# Patient Record
Sex: Female | Born: 2000 | Hispanic: Yes | Marital: Single | Smoking: Never smoker
Health system: Southern US, Community
[De-identification: ages and names within clinical notes are randomized; demographics above are authoritative.]

## PROBLEM LIST (undated history)

## (undated) DIAGNOSIS — J45909 Unspecified asthma, uncomplicated: Secondary | ICD-10-CM

## (undated) DIAGNOSIS — D649 Anemia, unspecified: Secondary | ICD-10-CM

---
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
## 2019-09-13 ENCOUNTER — Other Ambulatory Visit: Payer: Self-pay

## 2019-09-13 ENCOUNTER — Emergency Department (HOSPITAL_BASED_OUTPATIENT_CLINIC_OR_DEPARTMENT_OTHER)
Admission: EM | Admit: 2019-09-13 | Discharge: 2019-09-14 | Disposition: A | Discharge status: Home / Self Care | Payer: Medicaid Other | Attending: Emergency Medicine | Admitting: Emergency Medicine

## 2019-09-13 ENCOUNTER — Encounter (HOSPITAL_BASED_OUTPATIENT_CLINIC_OR_DEPARTMENT_OTHER): Payer: Self-pay | Admitting: Emergency Medicine

## 2019-09-13 DIAGNOSIS — Z5321 Procedure and treatment not carried out due to patient leaving prior to being seen by health care provider: Secondary | ICD-10-CM | POA: Insufficient documentation

## 2019-09-13 DIAGNOSIS — R079 Chest pain, unspecified: Secondary | ICD-10-CM | POA: Insufficient documentation

## 2019-09-13 HISTORY — DX: Unspecified asthma, uncomplicated: J45.909

## 2019-09-13 NOTE — ED Triage Notes (Signed)
Pt states she tried an edible for the first time on Saturday and had a panic attack that lasted about 4 hrs  Pt states she has been having chest pain since

## 2019-09-14 MED ORDER — IBUPROFEN 200 MG PO TABS
200.0000 mg | ORAL_TABLET | Freq: Once | ORAL | Status: AC
  Administered 2019-09-14: 200 mg via ORAL
  Filled 2019-09-14: qty 1

## 2020-07-26 ENCOUNTER — Other Ambulatory Visit: Payer: Self-pay

## 2020-07-26 ENCOUNTER — Emergency Department (HOSPITAL_BASED_OUTPATIENT_CLINIC_OR_DEPARTMENT_OTHER): Payer: Medicaid Other

## 2020-07-26 ENCOUNTER — Encounter (HOSPITAL_BASED_OUTPATIENT_CLINIC_OR_DEPARTMENT_OTHER): Payer: Self-pay | Admitting: Emergency Medicine

## 2020-07-26 ENCOUNTER — Emergency Department (HOSPITAL_BASED_OUTPATIENT_CLINIC_OR_DEPARTMENT_OTHER)
Admission: EM | Admit: 2020-07-26 | Discharge: 2020-07-26 | Disposition: A | Discharge status: Home / Self Care | Payer: Medicaid Other | Attending: Emergency Medicine | Admitting: Emergency Medicine

## 2020-07-26 DIAGNOSIS — J45909 Unspecified asthma, uncomplicated: Secondary | ICD-10-CM | POA: Insufficient documentation

## 2020-07-26 DIAGNOSIS — F419 Anxiety disorder, unspecified: Secondary | ICD-10-CM | POA: Diagnosis not present

## 2020-07-26 DIAGNOSIS — R0789 Other chest pain: Secondary | ICD-10-CM | POA: Insufficient documentation

## 2020-07-26 DIAGNOSIS — R000 Tachycardia, unspecified: Secondary | ICD-10-CM | POA: Diagnosis not present

## 2020-07-26 MED ORDER — HYDROXYZINE HCL 25 MG PO TABS: 25.0000 mg | ORAL_TABLET | Freq: Four times a day (QID) | ORAL | 0 refills | Status: AC

## 2020-07-26 MED ORDER — HYDROXYZINE HCL 25 MG PO TABS
25.0000 mg | ORAL_TABLET | Freq: Once | ORAL | Status: AC
  Administered 2020-07-26: 25 mg via ORAL
  Filled 2020-07-26: qty 1

## 2020-07-26 NOTE — ED Triage Notes (Signed)
Pt arrives pov, ambulatory to triage with c/o mid sternal and left side CP "for several months". Pt was seen at [facility] Friday, and was concerned that EKG not performed. Pt endorses anxiety, reports could be exacerbation of anxiety. Pt denies shob. Pt reports trying edibles in July last year and feels that her anxiety started then

## 2020-07-26 NOTE — Discharge Instructions (Addendum)
Please read the attached information about outpatient counseling.  Please note the substance abuse portion.  Please drink plenty of water please use Atarax which is also called hydroxyzine or Vistaril.  You may take this medication 3 times daily as needed for anxiety.

## 2020-07-26 NOTE — ED Notes (Signed)
Portable Xray at bedside.

## 2020-07-26 NOTE — ED Provider Notes (Signed)
[facility] EMERGENCY DEPARTMENT Provider Note   CSN: [id number] Arrival date & time: 07/26/20  1335     History Chief Complaint  Patient presents with   Chest Pain    Alicia Holloway is a 20 y.o. female.  HPI Patient is a 20 year old female with a past medical history significant for asthma and anemia she is presented today for intermittent chest pains that been ongoing for approximately 1 year.  She states that she was seen by her primary care doctor in the past and had a normal EKG and was given reassurance however she states that her symptoms continue intermittently.  She states that the pain is more of a pressure and tightness sensation she complains of this affecting her mid sternum as well as the entire left side of her chest.  She states that it seems to come on intermittently and without any significant provoking factors.  She states that when she feels the pain or any pressure or tightness she begins worrying about having a heart attack.  She states that she was worried she was having a heart attack today which prompted her to come to the ER.  She has no history of smoking she states that she has no history of recreational drug use apart from 1 year ago when she did a edible which seem to cause some anxiety and seem to precipitate these episodes.  She has not done any recreational drugs since.  Notably she was in [place] last night and did drink some alcoholic beverages she is not certain how many she had but she said less than 4.  States that she felt somewhat lightheaded yesterday and became very anxious about this as well.  She do not have any chest pain yesterday but states that she has had chest tightness all day today.  Denies any shortness of breath.  States that she has no nausea or vomiting no diarrhea.  No fevers or chills.  No chest trauma.  No history of pneumothorax  No recent surgeries, hospitalization, long travel, hemoptysis, estrogen containing OCP, cancer  history.  No unilateral leg swelling.  No history of PE or VTE.     Past Medical History:  Diagnosis Date   Anemia    Asthma     There are no problems to display for this patient.   History reviewed. No pertinent surgical history.   OB History   No obstetric history on file.     Family History  Problem Relation Age of Onset   Hypertension Mother    Hypertension Father    Cancer Other     Social History   Tobacco Use   Smoking status: Never   Smokeless tobacco: Never  Vaping Use   Vaping Use: Never used  Substance Use Topics   Alcohol use: Not Currently   Drug use: No    Home Medications Prior to Admission medications   Medication Sig Start Date End Date Taking? Authorizing Provider  hydrOXYzine (ATARAX/VISTARIL) 25 MG tablet Take 1 tablet (25 mg total) by mouth every 6 (six) hours for 14 days. 07/26/20 08/09/20 Yes Loriel Diehl, Stevphen Meuse S, PA  ferrous sulfate 325 (65 FE) MG tablet Take 1 tablet (325 mg total) by mouth daily. 05/09/11 05/08/12  Elson Areas, PA-C  ibuprofen (ADVIL,MOTRIN) 200 MG tablet Take 200 mg by mouth every 6 (six) hours as needed. For pain    [provider]    Allergies    Patient has no known allergies.  Review of  Systems   Review of Systems  Constitutional:  Negative for chills and fever.  HENT:  Negative for congestion.   Eyes:  Negative for pain.  Respiratory:  Negative for cough and shortness of breath.   Cardiovascular:  Positive for chest pain. Negative for leg swelling.  Gastrointestinal:  Negative for abdominal pain and vomiting.  Genitourinary:  Negative for dysuria.  Musculoskeletal:  Negative for myalgias.  Skin:  Negative for rash.  Neurological:  Negative for dizziness and headaches.   Physical Exam Updated Vital Signs BP 128/84   Pulse 84   Temp 98.5 F (36.9 C) (Oral)   Resp 16   Ht 5\' 6"  (1.676 m)   Wt 68 kg   LMP 07/05/2020   SpO2 99%   BMI 24.21 kg/m   Physical Exam Vitals and nursing note reviewed.   Constitutional:      General: She is not in acute distress.    Comments: Tearful 20 year old female appears anxious but in no acute distress.  HENT:     Head: Normocephalic and atraumatic.     Nose: Nose normal.  Eyes:     General: No scleral icterus. Cardiovascular:     Rate and Rhythm: Regular rhythm. Tachycardia present.     Pulses: Normal pulses.     Heart sounds: Normal heart sounds.  Pulmonary:     Effort: Pulmonary effort is normal. No respiratory distress.     Breath sounds: No wheezing.  Abdominal:     Palpations: Abdomen is soft.     Tenderness: There is no abdominal tenderness.  Musculoskeletal:     Cervical back: Normal range of motion.     Right lower leg: No edema.     Left lower leg: No edema.     Comments: No lower extremity edema or calf tenderness  Skin:    General: Skin is warm and dry.     Capillary Refill: Capillary refill takes less than 2 seconds.  Neurological:     Mental Status: She is alert. Mental status is at baseline.  Psychiatric:        Mood and Affect: Mood normal.        Behavior: Behavior normal.    ED Results / Procedures / Treatments   Labs (all labs ordered are listed, but only abnormal results are displayed) Labs Reviewed - No data to display  EKG EKG Interpretation  Date/Time:  Saturday July 26 2020 13:48:31 EDT Ventricular Rate:  100 PR Interval:  147 QRS Duration: 91 QT Interval:  349 QTC Calculation: 451 R Axis:   -10 Text Interpretation: Sinus tachycardia Confirmed by [email or web address] ([id number]) on 07/26/2020 1:59:28 PM  Radiology DG Chest Portable 1 View  Result Date: 07/26/2020 CLINICAL DATA:  C/o sporadic Lt sided chest pain since July 2021. Hx asthma (exercise-induced). Patient has nipple jewelry that she cannot remove EXAM: PORTABLE CHEST - 1 VIEW COMPARISON:  none FINDINGS: Lungs are clear. Heart size and mediastinal contours are within normal limits. No effusion.  No pneumothorax. Visualized bones unremarkable.  IMPRESSION: No acute cardiopulmonary disease. Electronically Signed   By: August 2021 M.D.   On: 07/26/2020 14:11    Procedures Procedures   Medications Ordered in ED Medications  hydrOXYzine (ATARAX/VISTARIL) tablet 25 mg (has no administration in time range)    ED Course  I have reviewed the triage vital signs and the nursing notes.  Pertinent labs & imaging results that were available during my care of the patient were reviewed by me and considered  in my medical decision making (see chart for details).    MDM Rules/Calculators/A&P                          Patient is 20 year old female healthy no history of ACS.  No history of smoking, diabetes, HTN, HLD.  She is not on any oral contraceptive medication she is PERC negative and has very atypical sounding chest pain seems to have been ongoing intermittently for 1 year.  States that she is very concerned about heart attack.  EKG is normal sinus rhythm no axis deviation heart rate is 100.  On my initial evaluation patient's heart rate is 100 on the monitor after talking slowly and encouraging some deep breathing patient's heart rate dropped to 84 she satting 100% on room air the entire time I was in the room with her.  Apart from being tearful she is very well-appearing.  She states that her pain is improved some just during our discussion.   I specifically doubt PE given that she is nontachycardic, hypoxic, and has been having symptoms for over a year.  Also doubt ACS, pericarditis, myocarditis, pneumothorax was ruled out with chest x-ray which shows no pneumothorax.  She also has good lung sounds in all fields.  She is also not short of breath.  Also doubt thoracic artery dissection or other acute emergent condition.  Patient discharged with Vistaril, resources for therapy in the area and recommendations for PCP follow-up.  Return precautions given.  She is understanding of plan.  Final Clinical Impression(s) / ED Diagnoses Final  diagnoses:  Atypical chest pain  Anxiety    Rx / DC Orders ED Discharge Orders          Ordered    hydrOXYzine (ATARAX/VISTARIL) 25 MG tablet  Every 6 hours        07/26/20 1425             Solon Augusta [place], [place] 07/26/20 1453    Virgina Norfolk, DO 07/27/20 0801

## 2020-08-13 ENCOUNTER — Encounter (HOSPITAL_BASED_OUTPATIENT_CLINIC_OR_DEPARTMENT_OTHER): Payer: Self-pay | Admitting: Emergency Medicine

## 2020-08-13 ENCOUNTER — Other Ambulatory Visit: Payer: Self-pay

## 2020-08-13 ENCOUNTER — Emergency Department (HOSPITAL_BASED_OUTPATIENT_CLINIC_OR_DEPARTMENT_OTHER)
Admission: EM | Admit: 2020-08-13 | Discharge: 2020-08-13 | Disposition: A | Discharge status: Home / Self Care | Payer: Medicaid Other | Attending: Emergency Medicine | Admitting: Emergency Medicine

## 2020-08-13 ENCOUNTER — Emergency Department (HOSPITAL_BASED_OUTPATIENT_CLINIC_OR_DEPARTMENT_OTHER): Payer: Medicaid Other

## 2020-08-13 DIAGNOSIS — R079 Chest pain, unspecified: Secondary | ICD-10-CM

## 2020-08-13 DIAGNOSIS — R072 Precordial pain: Secondary | ICD-10-CM | POA: Insufficient documentation

## 2020-08-13 DIAGNOSIS — J45909 Unspecified asthma, uncomplicated: Secondary | ICD-10-CM | POA: Diagnosis not present

## 2020-08-13 LAB — BASIC METABOLIC PANEL
Anion gap: 7 (ref 5–15)
BUN: 10 mg/dL (ref 6–20)
CO2: 26 mmol/L (ref 22–32)
Calcium: 9.1 mg/dL (ref 8.9–10.3)
Chloride: 103 mmol/L (ref 98–111)
Creatinine, Ser: 0.81 mg/dL (ref 0.44–1.00)
GFR, Estimated: >60 mL/min (ref >60)
Glucose, Bld: 98 mg/dL (ref 70–99)
Potassium: 4.2 mmol/L (ref 3.5–5.1)
Sodium: 136 mmol/L (ref 135–145)

## 2020-08-13 LAB — CBC
HCT: 37.4 % (ref 36.0–46.0)
Hemoglobin: 11.5 g/dL — ABNORMAL LOW (ref 12.0–15.0)
MCH: 22.7 pg — ABNORMAL LOW (ref 26.0–34.0)
MCHC: 30.7 g/dL (ref 30.0–36.0)
MCV: 73.9 fL — ABNORMAL LOW (ref 80.0–100.0)
Platelets: 251 10*3/uL (ref 150–400)
RBC: 5.06 MIL/uL (ref 3.87–5.11)
RDW: 16.4 % — ABNORMAL HIGH (ref 11.5–15.5)
WBC: 7.1 10*3/uL (ref 4.0–10.5)
nRBC: 0 % (ref 0.0–0.2)

## 2020-08-13 LAB — TROPONIN I (HIGH SENSITIVITY)
Troponin I (High Sensitivity): <2 ng/L (ref <18)
Troponin I (High Sensitivity): <2 ng/L (ref <18)

## 2020-08-13 LAB — PREGNANCY, URINE: Preg Test, Ur: NEGATIVE

## 2020-08-13 NOTE — ED Triage Notes (Signed)
Pt reports having chest pain while working today. Pt reports the pain is in the middle of her chest. Pt states this has been intermittent since July 2021.

## 2020-08-13 NOTE — Discharge Instructions (Addendum)
You were seen in the emergency department for chest pain.  You had blood work EKG and a chest x-ray that did not show an obvious explanation for your symptoms.  Please follow-up with your primary care doctor.  Return to the emergency department if any worsening or concerning symptoms.

## 2020-08-13 NOTE — ED Provider Notes (Signed)
[facility] EMERGENCY DEPARTMENT Provider Note   CSN: [id number] Arrival date & time: 08/13/20  1705     History Chief Complaint  Patient presents with   Chest Pain    Alicia Holloway is a 20 y.o. female.  She has a history of anemia.  She was at work today when she experienced substernal chest pain.  They checked her blood pressure there and found it to be elevated.  She said currently the pain is a 0.  She is had this pain before on and off for over a year.  She sometimes can make the pain happen with the way she moves her arms.  No reflux symptoms.  No radiation of the pain.  Not associated with nausea vomiting dizziness diaphoresis.  She is a non-smoker and denies any cocaine.  No history of young heart disease in her family.  The history is provided by the patient.  Chest Pain Pain location:  Substernal area Pain quality: stabbing   Pain radiates to:  Does not radiate Pain severity:  Moderate Onset quality:  Sudden Duration:  20 minutes Timing:  Sporadic Progression:  Resolved Chronicity:  Recurrent Context: movement and raising an arm   Relieved by:  None tried Worsened by:  Certain positions Ineffective treatments:  None tried Associated symptoms: no abdominal pain, no back pain, no cough, no diaphoresis, no fever, no headache, no heartburn, no nausea, no shortness of breath and no vomiting   Risk factors: no smoking       Past Medical History:  Diagnosis Date   Anemia    Asthma     There are no problems to display for this patient.   History reviewed. No pertinent surgical history.   OB History   No obstetric history on file.     Family History  Problem Relation Age of Onset   Hypertension Mother    Hypertension Father    Cancer Other     Social History   Tobacco Use   Smoking status: Never   Smokeless tobacco: Never  Vaping Use   Vaping Use: Never used  Substance Use Topics   Alcohol use: Not Currently   Drug use: No    Home  Medications Prior to Admission medications   Medication Sig Start Date End Date Taking? Authorizing Provider  ferrous sulfate 325 (65 FE) MG tablet Take 1 tablet (325 mg total) by mouth daily. 05/09/11 05/08/12  Elson Areas, PA-C  ibuprofen (ADVIL,MOTRIN) 200 MG tablet Take 200 mg by mouth every 6 (six) hours as needed. For pain    [provider]    Allergies    Patient has no known allergies.  Review of Systems   Review of Systems  Constitutional:  Negative for diaphoresis and fever.  HENT:  Negative for sore throat.   Eyes:  Negative for visual disturbance.  Respiratory:  Negative for cough and shortness of breath.   Cardiovascular:  Positive for chest pain.  Gastrointestinal:  Negative for abdominal pain, heartburn, nausea and vomiting.  Genitourinary:  Negative for dysuria.  Musculoskeletal:  Negative for back pain.  Skin:  Negative for rash.  Neurological:  Negative for headaches.   Physical Exam Updated Vital Signs BP 136/72 (BP Location: Left Arm)   Pulse 87   Temp 98.2 F (36.8 C) (Oral)   Resp 16   Ht 5' 6.5" (1.689 m)   Wt 68 kg   SpO2 100%   BMI 23.85 kg/m   Physical Exam Vitals and  nursing note reviewed.  Constitutional:      General: She is not in acute distress.    Appearance: Normal appearance. She is well-developed.  HENT:     Head: Normocephalic and atraumatic.  Eyes:     Conjunctiva/sclera: Conjunctivae normal.  Cardiovascular:     Rate and Rhythm: Normal rate and regular rhythm.     Heart sounds: No murmur heard. Pulmonary:     Effort: Pulmonary effort is normal. No respiratory distress.     Breath sounds: Normal breath sounds.  Abdominal:     Palpations: Abdomen is soft.     Tenderness: There is no abdominal tenderness. There is no guarding or rebound.  Musculoskeletal:        General: Normal range of motion.     Cervical back: Neck supple.     Right lower leg: No edema.     Left lower leg: No edema.  Skin:    General: Skin  is warm and dry.  Neurological:     General: No focal deficit present.     Mental Status: She is alert.    ED Results / Procedures / Treatments   Labs (all labs ordered are listed, but only abnormal results are displayed) Labs Reviewed  CBC - Abnormal; Notable for the following components:      Result Value   Hemoglobin 11.5 (*)    MCV 73.9 (*)    MCH 22.7 (*)    RDW 16.4 (*)    All other components within normal limits  BASIC METABOLIC PANEL  PREGNANCY, URINE  TROPONIN I (HIGH SENSITIVITY)  TROPONIN I (HIGH SENSITIVITY)    EKG EKG Interpretation  Date/Time:  Wednesday August 13 2020 17:21:22 EDT Ventricular Rate:  89 PR Interval:  134 QRS Duration: 72 QT Interval:  346 QTC Calculation: 420 R Axis:   31 Text Interpretation: Normal sinus rhythm Normal ECG No significant change since prior 6/21 Confirmed by Meridee Score [phone]) on 08/13/2020 5:24:09 PM  Radiology DG Chest 2 View  Result Date: 08/13/2020 CLINICAL DATA:  Chest pain EXAM: CHEST - 2 VIEW COMPARISON:  None. FINDINGS: The heart size and mediastinal contours are within normal limits. Both lungs are clear. The visualized skeletal structures are unremarkable. IMPRESSION: No active cardiopulmonary disease. Electronically Signed   By: Jasmine Pang M.D.   On: 08/13/2020 18:11    Procedures Procedures   Medications Ordered in ED Medications - No data to display  ED Course  I have reviewed the triage vital signs and the nursing notes.  Pertinent labs & imaging results that were available during my care of the patient were reviewed by me and considered in my medical decision making (see chart for details).    MDM Rules/Calculators/A&P                         This patient complains of substernal chest pain; this involves an extensive number of treatment Options and is a complaint that carries with it a high risk of complications and Morbidity. The differential includes ACS, PE, pneumothorax, musculoskeletal,  reflux, vascular  I ordered, reviewed and interpreted labs, which included CBC with normal white count, hemoglobin low similar to priors, chemistries normal, pregnancy test negative, troponin unremarkable  I ordered imaging studies which included chest x-ray and I independently    visualized and interpreted imaging which showed no acute findings  Previous records obtained and reviewed in epic, no recent admissions  After the interventions stated above, I  reevaluated the patient and found patient to be hemodynamically stable.  Satting 100% on room air.  No indications for further work-up at this time. recommended PCP follow-up.  Return instructions discussed   Final Clinical Impression(s) / ED Diagnoses Final diagnoses:  Nonspecific chest pain    Rx / DC Orders ED Discharge Orders     None        Terrilee Files, MD 08/14/20 1023

## 2021-03-23 ENCOUNTER — Emergency Department (HOSPITAL_COMMUNITY)
Admission: EM | Admit: 2021-03-23 | Discharge: 2021-03-23 | Disposition: A | Discharge status: Home / Self Care | Payer: Medicaid Other | Attending: Emergency Medicine | Admitting: Emergency Medicine

## 2021-03-23 ENCOUNTER — Other Ambulatory Visit: Payer: Self-pay

## 2021-03-23 ENCOUNTER — Encounter (HOSPITAL_COMMUNITY): Payer: Self-pay

## 2021-03-23 DIAGNOSIS — K6289 Other specified diseases of anus and rectum: Secondary | ICD-10-CM | POA: Diagnosis not present

## 2021-03-23 DIAGNOSIS — K625 Hemorrhage of anus and rectum: Secondary | ICD-10-CM | POA: Diagnosis present

## 2021-03-23 DIAGNOSIS — K5900 Constipation, unspecified: Secondary | ICD-10-CM | POA: Insufficient documentation

## 2021-03-23 LAB — POC OCCULT BLOOD, ED: Fecal Occult Bld: NEGATIVE

## 2021-03-23 NOTE — ED Provider Notes (Signed)
[facility]-EMERGENCY DEPT Provider Note   CSN: [id number] Arrival date & time: 03/23/21  1846     History Chief Complaint  Patient presents with   Rectal Bleeding    Alicia Holloway is a 21 y.o. female who presents to the emergency department with rectal bleeding that has been intermittent over the last several months.  Patient states she has constipation at baseline and most of her bowel movements are sharp and painful.  Intermittently she does endorse having bright red blood when she wipes after having a bowel movement.  Today patient had similar episode and wiped her rectum and noticed a foreign body.  This appeared to be glass or hard plastic for the patient.  She saved it and came to the emergency department.   Rectal Bleeding     Home Medications Prior to Admission medications   Medication Sig Start Date End Date Taking? Authorizing Provider  ferrous sulfate 325 (65 FE) MG tablet Take 1 tablet (325 mg total) by mouth daily. 05/09/11 05/08/12  Elson Areas, PA-C  ibuprofen (ADVIL,MOTRIN) 200 MG tablet Take 200 mg by mouth every 6 (six) hours as needed. For pain    [provider]      Allergies    Patient has no known allergies.    Review of Systems   Review of Systems  Gastrointestinal:  Positive for hematochezia.  All other systems reviewed and are negative.  Physical Exam Updated Vital Signs BP 134/88 (BP Location: Left Arm)    Pulse 66    Temp 98.4 F (36.9 C) (Oral)    Resp 17    Ht 5\' 7"  (1.702 m)    Wt 52.2 kg    SpO2 98%    BMI 18.01 kg/m  Physical Exam Vitals and nursing note reviewed. Exam conducted with a chaperone present.  Constitutional:      Appearance: Normal appearance.  HENT:     Head: Normocephalic and atraumatic.  Eyes:     General:        Right eye: No discharge.        Left eye: No discharge.     Conjunctiva/sclera: Conjunctivae normal.  Pulmonary:     Effort: Pulmonary effort is normal.  Genitourinary:     Rectum: Guaiac result negative. No tenderness, anal fissure, external hemorrhoid or internal hemorrhoid.  Skin:    General: Skin is warm and dry.     Findings: No rash.  Neurological:     General: No focal deficit present.     Mental Status: She is alert.  Psychiatric:        Mood and Affect: Mood normal.        Behavior: Behavior normal.    ED Results / Procedures / Treatments   Labs (all labs ordered are listed, but only abnormal results are displayed) Labs Reviewed  POC OCCULT BLOOD, ED    EKG None  Radiology No results found.  Procedures Procedures    Medications Ordered in ED Medications - No data to display  ED Course/ Medical Decision Making/ A&P                           Medical Decision Making  Alicia Holloway is a 21 y.o. female patient who presents to the emergency department with rectal bleeding and foreign body.  It does appear to be a very small piece of hard plastic or glass.  I performed a rectal exam which is  normal. No hemorrhoids or anal fissures. Point-of-care occult blood was negative.  I do not think that she has any acute distress or need of emergent care at this time.  I discussed strict return precautions.  We did talk at length at the bedside with regards to her constipation.  I instructed her to increase her fiber intake in addition to water intake.  Vitals are completely normal.  Do believe she is safe for the outpatient setting.  All questions or concerns addressed.  She is here for discharge.   Final Clinical Impression(s) / ED Diagnoses Final diagnoses:  Rectal pain    Rx / DC Orders ED Discharge Orders     None         Jolyn Lent 03/23/21 2043    Mancel Bale, MD 03/24/21 1231

## 2021-03-23 NOTE — ED Triage Notes (Signed)
Pt states that she had piece of glass in her stool, she states that this happen at 4 pm today.

## 2021-03-23 NOTE — Discharge Instructions (Signed)
Please increase your fiber and water intake as this will help your constipation.  I would like for you to follow-up with your primary care doctor for further evaluation.  Return to the emergency department for worsening symptoms.

## 2021-09-24 IMAGING — DX DG CHEST 2V
2 series · 2 of 2 positions shown · non-contrast
Comparison: None.

CLINICAL DATA: Chest pain

EXAM:
CHEST - 2 VIEW

[chest pa]
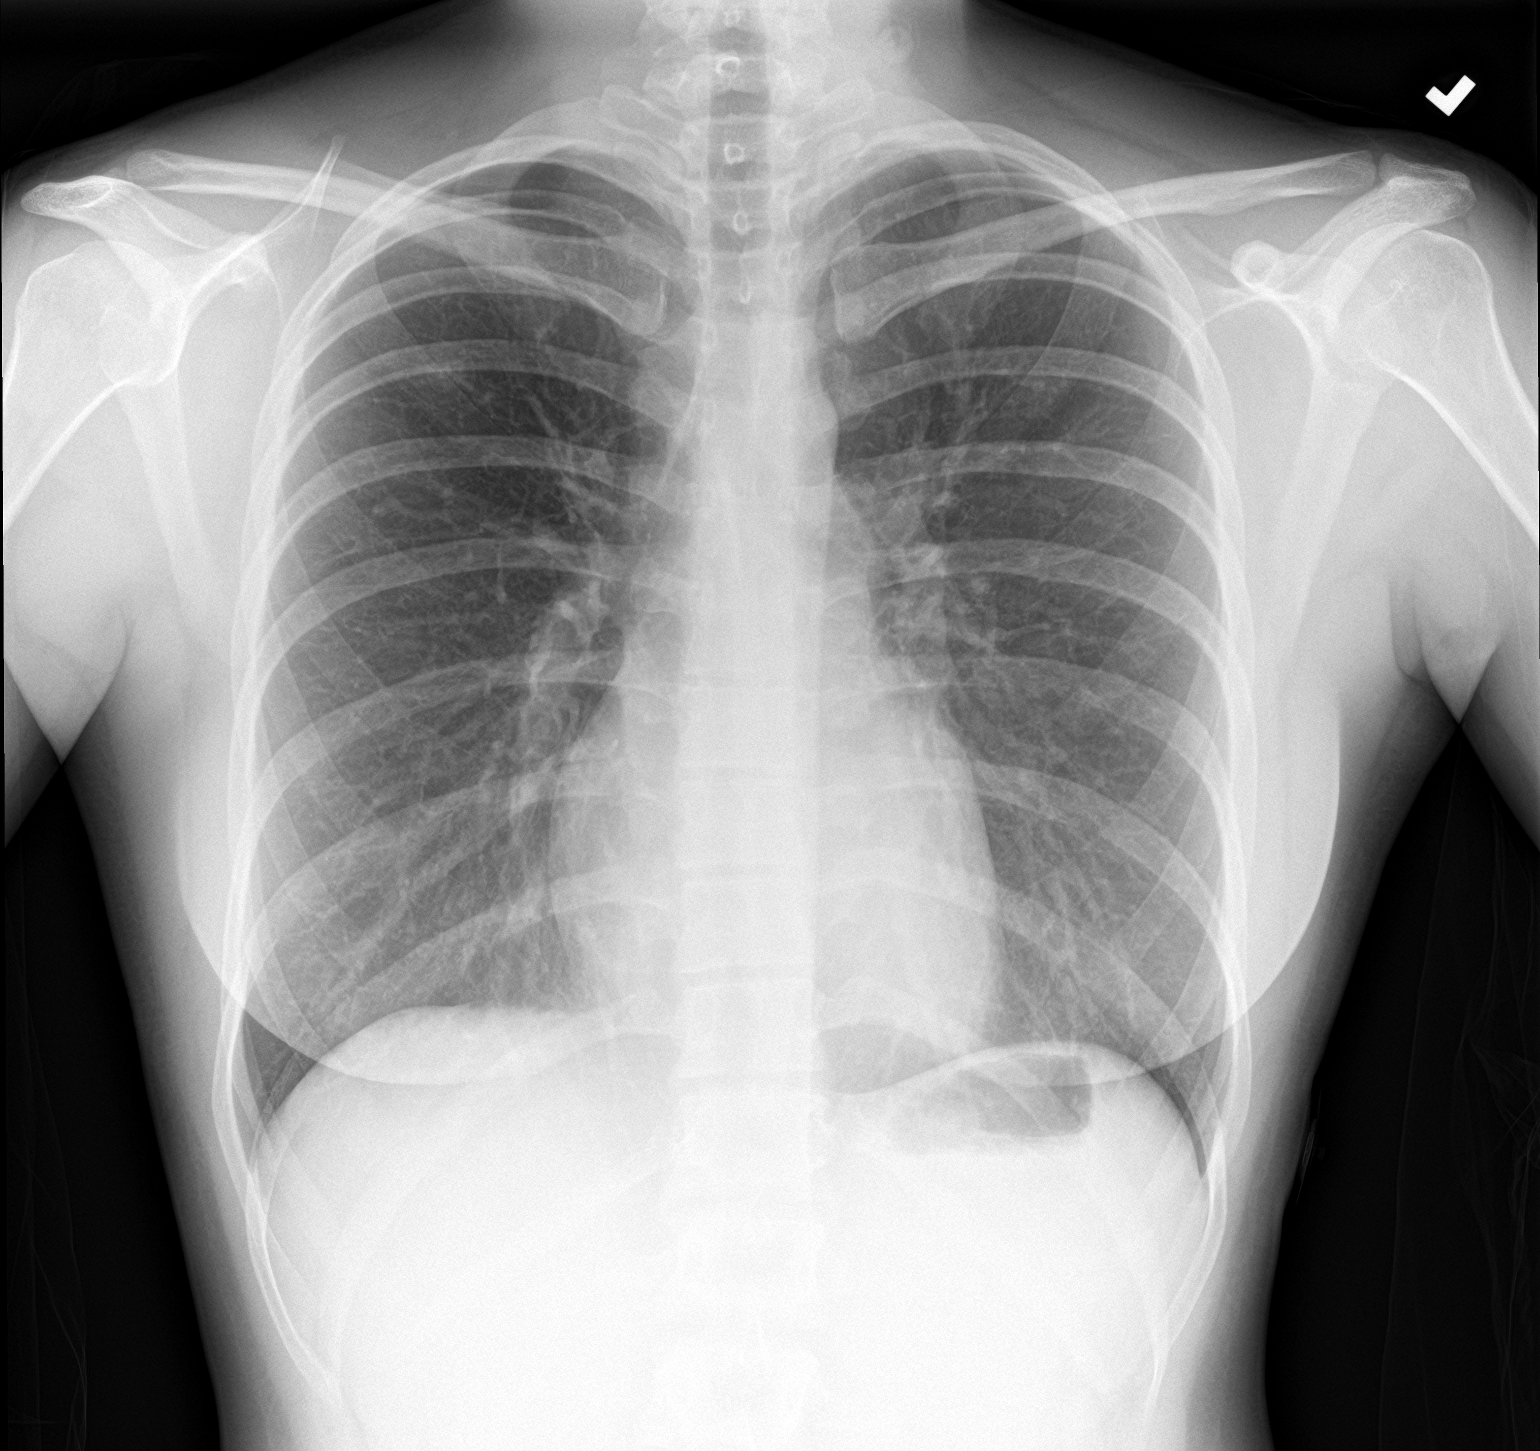

[chest lat]
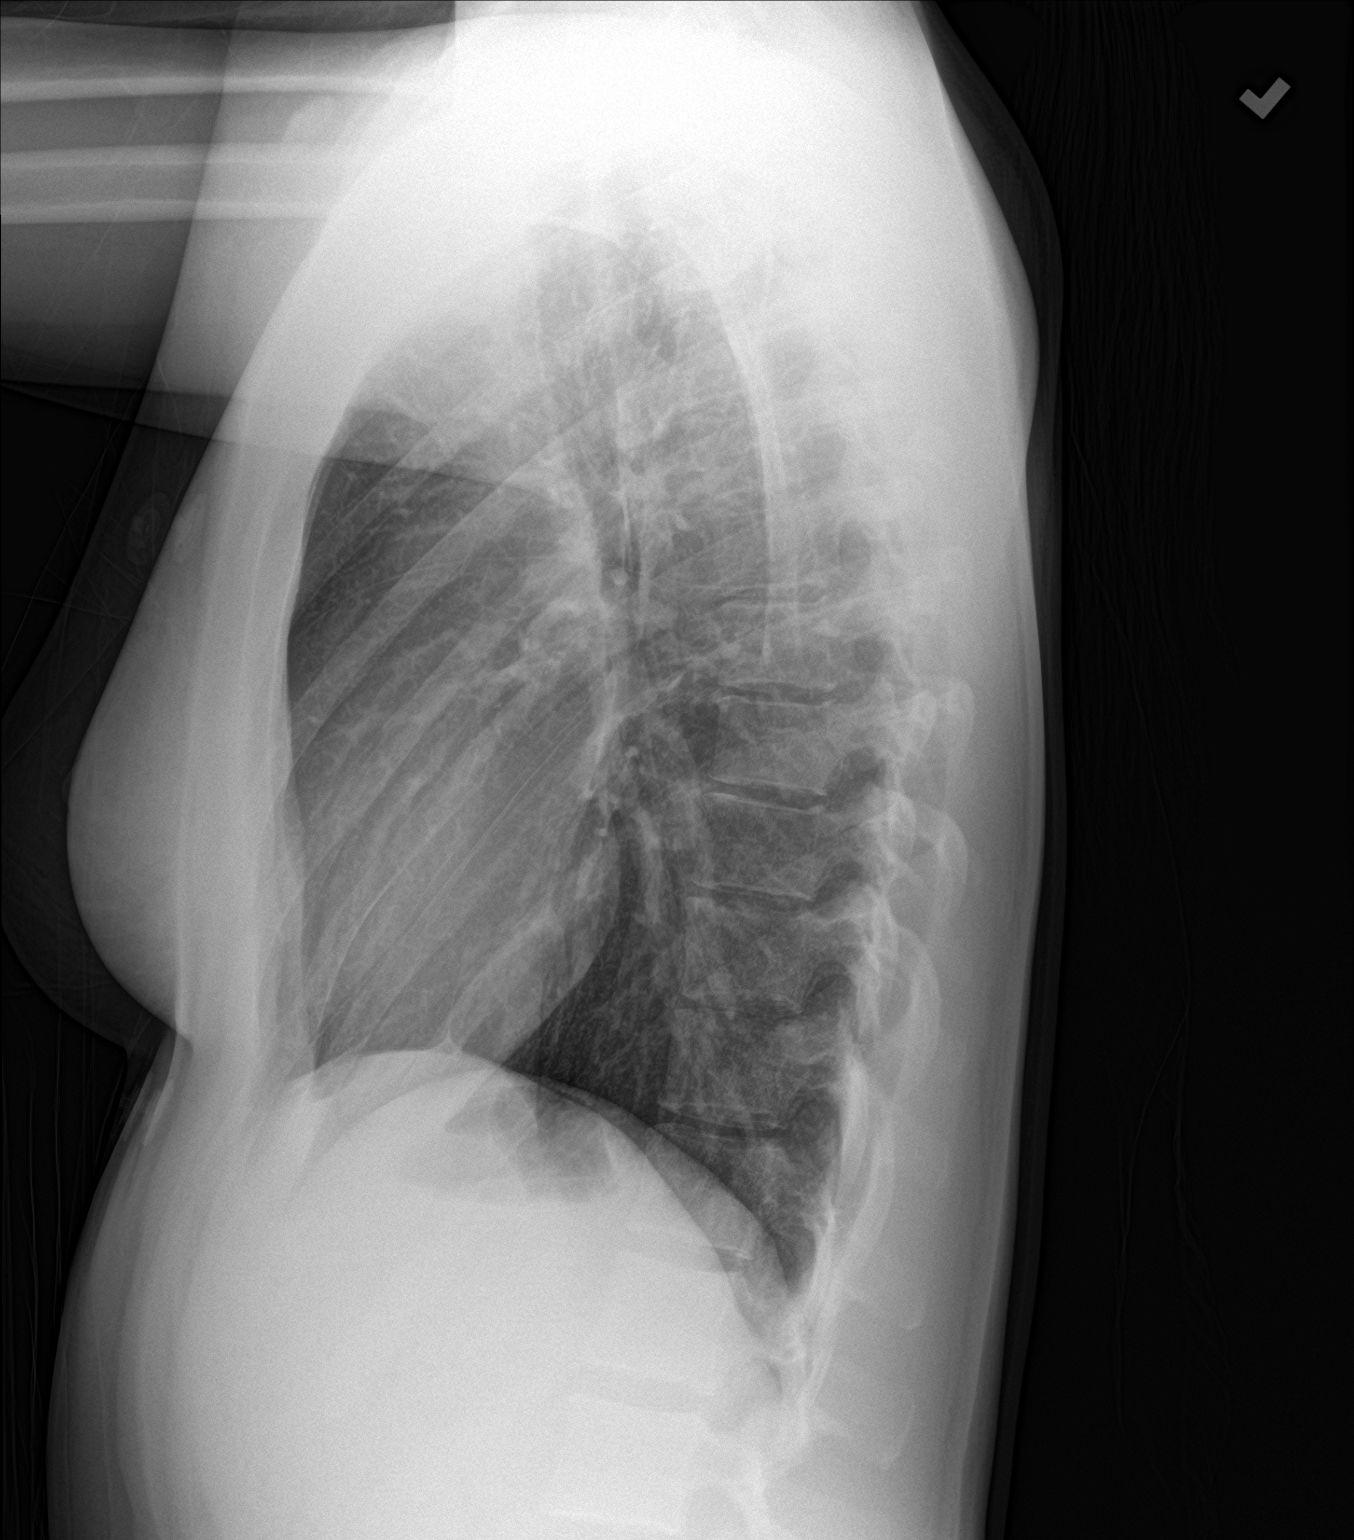

[2 of 2 positions shown; findings below may reference images not displayed]

FINDINGS: The heart size and mediastinal contours are within normal limits.
Both lungs are clear. The visualized skeletal structures are
unremarkable.
IMPRESSION: No active cardiopulmonary disease.

## 2022-07-09 ENCOUNTER — Encounter: Payer: Self-pay | Admitting: Allergy

## 2022-07-09 ENCOUNTER — Other Ambulatory Visit: Payer: Self-pay | Admitting: Allergy

## 2022-07-09 ENCOUNTER — Other Ambulatory Visit: Payer: Self-pay

## 2022-07-09 ENCOUNTER — Ambulatory Visit (INDEPENDENT_AMBULATORY_CARE_PROVIDER_SITE_OTHER): Payer: Managed Care, Other (non HMO) | Admitting: Allergy

## 2022-07-09 VITALS — BP 112/70 | HR 61 | Temp 98.3°F | Resp 14 | Ht 66.0 in | Wt 158.1 lb

## 2022-07-09 DIAGNOSIS — H1013 Acute atopic conjunctivitis, bilateral: Secondary | ICD-10-CM | POA: Diagnosis not present

## 2022-07-09 DIAGNOSIS — T781XXD Other adverse food reactions, not elsewhere classified, subsequent encounter: Secondary | ICD-10-CM

## 2022-07-09 DIAGNOSIS — J302 Other seasonal allergic rhinitis: Secondary | ICD-10-CM | POA: Diagnosis not present

## 2022-07-09 DIAGNOSIS — J3089 Other allergic rhinitis: Secondary | ICD-10-CM | POA: Diagnosis not present

## 2022-07-09 DIAGNOSIS — J45990 Exercise induced bronchospasm: Secondary | ICD-10-CM | POA: Diagnosis not present

## 2022-07-09 DIAGNOSIS — L708 Other acne: Secondary | ICD-10-CM

## 2022-07-09 DIAGNOSIS — T781XXA Other adverse food reactions, not elsewhere classified, initial encounter: Secondary | ICD-10-CM

## 2022-07-09 MED ORDER — CETIRIZINE HCL 10 MG PO TABS: 10.0000 mg | ORAL_TABLET | Freq: Every day | ORAL | 5 refills | Status: AC | PRN

## 2022-07-09 MED ORDER — ALBUTEROL SULFATE HFA 108 (90 BASE) MCG/ACT IN AERS: 2.0000 | INHALATION_SPRAY | Freq: Four times a day (QID) | RESPIRATORY_TRACT | 1 refills | Status: DC | PRN

## 2022-07-09 MED ORDER — OLOPATADINE HCL 0.2 % OP SOLN: 1.0000 [drp] | Freq: Every day | OPHTHALMIC | 5 refills | Status: AC | PRN

## 2022-07-09 MED ORDER — EPINEPHRINE 0.3 MG/0.3ML IJ SOAJ: 0.3000 mg | INTRAMUSCULAR | 1 refills | Status: AC | PRN

## 2022-07-09 NOTE — Progress Notes (Signed)
New Patient Note  RE: Alicia Holloway MRN: [id number] DOB: 07-21-2000 Date of Office Visit: 07/09/2022  Primary care provider: Verlee Rossetti, PA-C  Chief Complaint: Acne  History of present illness: Alicia Holloway is a 22 y.o. female presenting today for evaluation of possible food allergy.  She wants to get tested to see if she is allergic to anything.  She states she started to develop acne and was not sure if it is due to her diet.  She states she has had issues with acne for past 1 year.   She states she eats a lot of dairy and thus is concerned this could be worsening symptoms.  She tried to change her diet but states it is hard.    She states she is a picky eater and has anxiety about eating new foods.  She is on doxycyline now for past 4 months for acne that is helpful.  She is seeing a dermatologist.     She denies any hives/swelling, respiratory, GI or CV related symptoms after eating foods. Denies food allergy in childhood.  No history of eczema.  She states when she was young she had a lot of second hand smoke and states she was prescribed a nebulizer.  She is not in that environment anymore.  She does have an albuterol for exercise induced asthma but reports infrequent use.   She reports mild symptoms of nasal congestion/drainge, snezing, itchy/watyer eyes.  She does not take any medications for the symptoms.   Review of systems in the past 4 weeks: Review of Systems  Constitutional: Negative.   HENT: Negative.    Eyes: Negative.   Respiratory: Negative.    Cardiovascular: Negative.   Gastrointestinal: Negative.   Musculoskeletal: Negative.   Skin:  Positive for rash.  Allergic/Immunologic: Negative.   Neurological: Negative.     All other systems negative unless noted above in HPI  Past medical history: Past Medical History:  Diagnosis Date   Anemia    Asthma     Past surgical history: History reviewed. No pertinent surgical history.  Family history:   Family History  Problem Relation Age of Onset   Angioedema Mother    Asthma Mother    Hypertension Mother    Allergic rhinitis Father    Hypertension Father    Eczema Sister    Cancer Other     Social history: Lives in a apartment with carpeting in the bedroom with gas heating and central cooling.  Cat in the home.  Cat and dogs outside the home.  No concern for water damage, mildew or roaches in the home.  She is a [employer] forces operational officer.  Denies smoking history.    Medication List: Current Outpatient Medications  Medication Sig Dispense Refill   albuterol (VENTOLIN HFA) 108 (90 Base) MCG/ACT inhaler Inhale into the lungs.     albuterol (VENTOLIN HFA) 108 (90 Base) MCG/ACT inhaler Inhale 2 puffs into the lungs every 6 (six) hours as needed for wheezing or shortness of breath. 18 g 1   cetirizine (ZYRTEC) 10 MG tablet Take 1 tablet (10 mg total) by mouth daily as needed for allergies. 30 tablet 5   cyanocobalamin (VITAMIN B12) 1000 MCG tablet Take by mouth.     EPINEPHrine (EPIPEN 2-PAK) 0.3 mg/0.3 mL IJ SOAJ injection Inject 0.3 mg into the muscle as needed for anaphylaxis. 0.3 mL 1   ferrous sulfate 325 (65 FE) MG EC tablet Take by mouth.     ibuprofen (ADVIL,MOTRIN) 200  MG tablet Take 200 mg by mouth every 6 (six) hours as needed. For pain     Olopatadine HCl (PATADAY) 0.2 % SOLN Place 1 drop into both eyes daily as needed. 2.5 mL 5   No current facility-administered medications for this visit.    Known medication allergies: No Known Allergies   Physical examination: Blood pressure 112/70, pulse 61, temperature 98.3 F (36.8 C), resp. rate 14, height 5\' 6"  (1.676 m), weight 158 lb 1.6 oz (71.7 kg), SpO2 98 %.  General: Alert, interactive, in no acute distress. HEENT: PERRLA, TMs pearly gray, turbinates non-edematous without discharge, post-pharynx non erythematous. Neck: Supple without lymphadenopathy. Lungs: Clear to auscultation without wheezing, rhonchi or rales. {no  increased work of breathing. CV: Normal S1, S2 without murmurs. Abdomen: Nondistended, nontender. Skin: Hyperpigmented and healed ulcerative scars from acne on the cheeks and forehead . Extremities:  No clubbing, cyanosis or edema. Neuro:   Grossly intact.  Diagnositics/Labs  Spirometry: FEV1: 2.87L 88%, FVC: 3.77L 100%, ratio consistent with nonobstructive pattern  Allergy testing:   Airborne Adult Perc - 07/09/22 1100     Time Antigen Placed 1104    Allergen Manufacturer Waynette Buttery    Location Back    Number of Test 55    1. Control-Buffer 50% Glycerol Negative    2. Control-Histamine 2+    3. Bahia 2+    4. [place] 2+    5. Johnson 2+    6. Kentucky Blue 2+    7. Meadow Fescue 2+    8. Perennial Rye 2+    9. Timothy 3+    10. Ragweed Mix 2+    11. Cocklebur 2+    12. Plantain,  English 2+    13. Baccharis 2+    14. Dog Fennel 2+    15. Guernsey Thistle 2+    16. Lamb's Quarters 2+    17. Sheep Sorrell Negative    18. Rough Pigweed 2+    19. Marsh Elder, Rough 2+    20. Mugwort, Common 2+    21. Box, Elder Negative    22. Cedar, red Negative    23. Sweet Gum 2+    24. Pecan Pollen 2+    25. Pine Mix 2+    26. Walnut, Black Pollen 2+    27. Red Mulberry 2+    28. Ash Mix Negative    29. Birch Mix 3+    30. Beech American 3+    31. Cottonwood, [place] Negative    32. Hickory, White 2+    33. Maple Mix 2+    34. Oak, [place] Mix 2+    35. Sycamore Eastern Negative    36. Alternaria Alternata Negative    37. Cladosporium Herbarum 2+    38. Aspergillus Mix 2+    39. Penicillium Mix Negative    40. Bipolaris Sorokiniana (Helminthosporium) 2+    41. Drechslera Spicifera (Curvularia) 3+    42. Mucor Plumbeus 2+    43. Fusarium Moniliforme 2+    44. Aureobasidium Pullulans (pullulara) 2+    45. Rhizopus Oryzae 2+    46. Botrytis Cinera 2+    47. Epicoccum Nigrum 2+    48. Phoma Betae Negative    49. Dust Mite Mix 2+    50. Cat Hair 10,000 BAU/ml 2+    51.  Dog  Epithelia 2+    52. Mixed Feathers Negative    53. Horse Epithelia 3+    54. Cockroach, German 3+  55. Tobacco Leaf 3+             Food Adult Perc - 07/09/22 1100     Time Antigen Placed 1104    Allergen Manufacturer Waynette Buttery    Location Back    Number of allergen test 21   1. Peanut 2+    2. Soybean 2+    3. Wheat 2+    5. Milk, Cow Negative    6. Casein Negative    7. Egg White, Chicken Negative    8. Shellfish Mix 2+    9. Fish Mix 2+    18. Trout Negative    19. Tuna 2+    20. Salmon 2+    21. Flounder 2+    22. Codfish Negative    23. Shrimp Negative    24. Crab 2+    25. Lobster 2+    26. Oyster Negative    27. Scallops Negative    46. Mushrooms Negative    61. Blueberry Negative    72. Mustard 2+             Allergy testing results were read and interpreted by provider, documented by clinical staff.   Assessment and plan: Adverse food reaction Acne Allergic rhinitis with conjunctivitis Exercise-induced asthma  - Food allergy testing today is positive to peanut, soybean, wheat, shellfish (lobster, crab), fish (tuna, salmon, flounder), mustard - With the above foods pay attention to symptoms after ingestion and if noting issues then would remove from diet.  If not noting symptoms then you are sensitized and would keep in the diet.   - Have access to self-injectable epinephrine (Epipen or AuviQ) 0.3mg  at all times - Follow emergency action plan in case of allergic reaction  - we have discussed the following in regards to foods:   Allergy: food allergy is when you have eaten a food, developed an allergic reaction after eating the food and have IgE to the food (positive food testing either by skin testing or blood testing).  Food allergy could lead to life threatening symptoms  Sensitivity: occurs when you have IgE to a food (positive food testing either by skin testing or blood testing) but is a food you eat without any issues.  This is not an allergy and  we recommend keeping the food in the diet  Intolerance: this is when you have negative testing by either skin testing or blood testing thus not allergic but the food causes symptoms (like belly pain, bloating, diarrhea etc) with ingestion.  These foods should be avoided to prevent symptoms.    - Environmental allergy testing today showed: grasses, ragweed, weeds, trees, indoor molds, outdoor molds, dust mite, cat, dog, horse, cockroach and tobacco leaf - Copy of test results provided.  - Avoidance measures provided. Can use if needed:  Antihistamine like Zyrtec, Allegra or Xyzal daily as needed For itchy/watery eyes can use Pataday 1 drop each eye daily as needed For nasal cnogestion/drainage can use nasals spray like Nasacort for congestion and Astepro for drainage.   These are all OTC allergy medications. - You can use an extra dose of the antihistamine, if needed, for breakthrough symptoms.  - Consider nasal saline rinses 1-2 times daily to remove allergens from the nasal cavities as well as help with mucous clearance (this is especially helpful to do before the nasal sprays are given) - Consider allergy shots as a means of long-term control if medication management is not effective.  - Allergy shots "re-train" and "  reset" the immune system to ignore environmental allergens and decrease the resulting immune response to those allergens (sneezing, itchy watery eyes, runny nose, nasal congestion, etc).    - Allergy shots improve symptoms in 75-85% of patients.  - We can discuss more at a future appointment if the medications are not working for you.  - Have access to albuterol inhaler 2 puffs every 4-6 hours as needed for cough/wheeze/shortness of breath/chest tightness.  May use 15-20 minutes prior to activity.   Monitor frequency of use.   - Lung function today is normal  Follow-up in 6 months or sooner if needed   I appreciate the opportunity to take part in Lilyona's care. Please do not  hesitate to contact me with questions.  Sincerely,   Margo Aye, MD Allergy/Immunology [facility]

## 2022-07-09 NOTE — Patient Instructions (Addendum)
-   Food allergy testing today is positive to peanut, soybean, wheat, shellfish (lobster, crab), fish (tuna, salmon, flounder), mustard - With the above foods pay attention to symptoms after ingestion and if noting issues then would remove from diet.  If not noting symptoms then you are sensitized and would keep in the diet.   - Have access to self-injectable epinephrine (Epipen or AuviQ) 0.3mg  at all times - Follow emergency action plan in case of allergic reaction  - we have discussed the following in regards to foods:   Allergy: food allergy is when you have eaten a food, developed an allergic reaction after eating the food and have IgE to the food (positive food testing either by skin testing or blood testing).  Food allergy could lead to life threatening symptoms  Sensitivity: occurs when you have IgE to a food (positive food testing either by skin testing or blood testing) but is a food you eat without any issues.  This is not an allergy and we recommend keeping the food in the diet  Intolerance: this is when you have negative testing by either skin testing or blood testing thus not allergic but the food causes symptoms (like belly pain, bloating, diarrhea etc) with ingestion.  These foods should be avoided to prevent symptoms.     - Environmental allergy testing today showed: grasses, ragweed, weeds, trees, indoor molds, outdoor molds, dust mite, cat, dog, horse, cockroach and tobacco leaf - Copy of test results provided.  - Avoidance measures provided. Can use if needed:  Antihistamine like Zyrtec, Allegra or Xyzal daily as needed For itchy/watery eyes can use Pataday 1 drop each eye daily as needed For nasal cnogestion/drainage can use nasals spray like Nasacort for congestion and Astepro for drainage.   These are all OTC allergy medications. - You can use an extra dose of the antihistamine, if needed, for breakthrough symptoms.  - Consider nasal saline rinses 1-2 times daily to remove  allergens from the nasal cavities as well as help with mucous clearance (this is especially helpful to do before the nasal sprays are given) - Consider allergy shots as a means of long-term control if medication management is not effective.  - Allergy shots "re-train" and "reset" the immune system to ignore environmental allergens and decrease the resulting immune response to those allergens (sneezing, itchy watery eyes, runny nose, nasal congestion, etc).    - Allergy shots improve symptoms in 75-85% of patients.  - We can discuss more at a future appointment if the medications are not working for you.  - Have access to albuterol inhaler 2 puffs every 4-6 hours as needed for cough/wheeze/shortness of breath/chest tightness.  May use 15-20 minutes prior to activity.   Monitor frequency of use.   - Lung function today is normal  Follow-up in 6 months or sooner if needed

## 2022-12-31 ENCOUNTER — Ambulatory Visit: Payer: Managed Care, Other (non HMO) | Admitting: Allergy
